# Patient Record
Sex: Male | Born: 1980 | Race: White | Hispanic: No | Marital: Married | Smoking: Never smoker
Health system: Southern US, Community
[De-identification: ages and names within clinical notes are randomized; demographics above are authoritative.]

## PROBLEM LIST (undated history)

## (undated) DIAGNOSIS — I10 Essential (primary) hypertension: Secondary | ICD-10-CM

## (undated) HISTORY — DX: Essential (primary) hypertension: I10

## (undated) HISTORY — PX: NO PAST SURGERIES: SHX2092

---

## 2020-11-30 ENCOUNTER — Other Ambulatory Visit (INDEPENDENT_AMBULATORY_CARE_PROVIDER_SITE_OTHER): Payer: PRIVATE HEALTH INSURANCE

## 2020-11-30 ENCOUNTER — Encounter: Payer: Self-pay | Admitting: Gastroenterology

## 2020-11-30 ENCOUNTER — Ambulatory Visit (INDEPENDENT_AMBULATORY_CARE_PROVIDER_SITE_OTHER): Payer: PRIVATE HEALTH INSURANCE | Admitting: Gastroenterology

## 2020-11-30 ENCOUNTER — Other Ambulatory Visit: Payer: Self-pay

## 2020-11-30 VITALS — BP 142/84 | HR 80 | Ht 67.0 in | Wt 214.4 lb

## 2020-11-30 DIAGNOSIS — D696 Thrombocytopenia, unspecified: Secondary | ICD-10-CM

## 2020-11-30 DIAGNOSIS — R748 Abnormal levels of other serum enzymes: Secondary | ICD-10-CM | POA: Diagnosis not present

## 2020-11-30 LAB — PROTIME-INR
INR: 1 ratio (ref 0.8–1.0)
Prothrombin Time: 11.7 s (ref 9.6–13.1)

## 2020-11-30 NOTE — Patient Instructions (Signed)
LABS:  Lab work has been ordered for you today. Our lab is located in the basement. Press "B" on the elevator. The lab is located at the first door on the left as you exit the elevator.  IMAGING:  . You will be contacted by [facility] Scheduling (Your caller ID will indicate phone # [phone]) in the next 2 days to schedule your Liver Elastography Ultrasound. If you have not heard from them within 2 business days, please call [facility] Scheduling at [phone] to follow up on the status of your appointment.    Liver Elastography Various chronic liver diseases such as hepatitis B, C, and fatty liver disease can lead to tissue damage and subsequent scar tissue formation. As the scar tissue accumulates, the liver loses some of its elasticity and becomes stiffer. Liver elastography involves the use of a surface ultrasound probe that delivers a low frequency pulse or shear wave to a small volume of liver tissue under the rib cage. The transmission of the sound wave is completely painless. How Is a Liver Elastography Performed? The liver is located in the right upper abdomen under the rib cage. Patients are asked to lie flat on an examination table. A technician places the FibroScan probe between the ribs on the right side of the lower chest wall. A series of 10 painless pulses are then applied to the liver. The results are recorded on the equipment and an overall liver stiffness score is generated. This score is then interpreted by a qualified physician to predict the likelihood of advanced fibrosis or cirrhosis.  Patients are asked to wear loose clothing and should not consume any liquids or solids for a minimum of 4 hours before the test to increase the likelihood of obtaining reliable test results. The scan will take 10 to 15 minutes to complete, but patients should plan on being available for 30 minutes to allow time for preparation

## 2020-11-30 NOTE — Progress Notes (Addendum)
Referring Provider: Drema Halon, FNP Primary Care Physician:  Patient, No Pcp Per  Reason for Consultation: Abnormal liver enzymes   IMPRESSION:  Elevated transaminases: Likely fatty liver by history    - serologies for HCV and HBV negative    - proceed with further evaluation Chronic thrombocytopenia: Suggests advanced fibrosis +/- cirrhosis    - no ongoing regular alcohol use    - FibroSCAN and FibroSURE for staging Fatty liver on prior imaging several years ago GERD - controlled with diet changes  Suspected fatty liver. Briefly reviewed the natural history and treatment options. Treatment is currently focused on working towards a healthy weight, regular exercise, and maximizing control of prediabetes and cholesterol abnormalities. Hopefully, medical therapy will be available in the future. There is a known increased risk of cardiovascular disease in the patients with NASH.   Given concurrent pre-diabetes and thrombocytopenia, I am recommending that we stage fatty liver disease need now to provide adequate care in the future.   PLAN: ANA, IgG, PT/INR FibroSCAN NASH FibroSURE Declined pantoprazole 40 mg QAM Follow-up after imaging  Please see the "Patient Instructions" section for addition details about the plan.  HPI: Sisto Edgerly is a 40 y.o. male referred by NP Ladona Ridgel for further evaluation of abnormal liver enzymes.  The history is obtained through the patient and review of his electronic health record.  He has a history of obesity, hypertension, hyperlipidemia, insulin resistance, vitamin D deficiency, and chronic thrombocytopenia. His wife and son accompany him to this appointment. He works for the [employer] in the [employer]. He is from [place].   Diagnosed with fatty liver in 2016 in [place] when he had an ultrasound performed at that time.   2020 had an annual physical and was diagnosed with hyperlipidemia, hypertension, and low  platelets. He made an effort to improve diet and exercise more regularly.   Prior blood donation several years ago.  No prior blood transfusion.  No history of jaundice or scleral icterus.  No history of use or experimentation with IV or intranasal street drugs.  No history of autoimmune disease.   Drinks beer socially, not even weekly. No history of heavy alcohol use.   GI ROS is negative except for reflux. But, this has largely resolved with the diet changes he made several years ago. Symptoms are not frequent enough to warrant treatment. He will use a Children's PeptoBismal PRN.   Labs 11/05/2019: TSH normal Labs 02/02/2020 show a platelet count of 98,000, hepatitis C antibody nonreactive, hep B core antibody total negative, hep B surface antigen negative, hep B surface antibody negative, iron 87, ferritin 236 Labs 10/15/2020 show a white count of 4.7 hemoglobin 17.4, platelets 105, RDW 13; normal CMP except for an AST of 52 and an ALT of 127.  Sodium 139, creatinine 1.04, total bilirubin 0.7.  No known family history of liver disease except for a brother with fatty liver. Father with diabetic kidney disease. Mother has hypertension.  No known family history of colon cancer or polyps. No family history of uterine/endometrial cancer, pancreatic cancer or gastric/stomach cancer.   Past Medical History:  Diagnosis Date  . Hypertension     Past Surgical History:  Procedure Laterality Date  . NO PAST SURGERIES      Current Outpatient Medications  Medication Sig Dispense Refill  . MILK THISTLE PO Take 1 tablet by mouth daily.    Marland Kitchen telmisartan (MICARDIS) 40 MG tablet Take 40 mg by mouth  daily.     No current facility-administered medications for this visit.    Allergies as of 11/30/2020  . (No Known Allergies)    Family History  Problem Relation Age of Onset  . Hypertension Mother   . Diabetes Father   . Kidney disease Father   . Colon cancer Neg Hx   . Esophageal cancer Neg Hx    . Pancreatic cancer Neg Hx   . Stomach cancer Neg Hx      Review of Systems: 12 system ROS is negative except as noted above.   Physical Exam: General:   Alert, in NAD. No scleral icterus. No bilateral temporal wasting.  Heart:  Regular rate and rhythm; no murmurs Pulm: Clear anteriorly; no wheezing Abdomen:  Soft. Nontender. Nondistended. Normal bowel sounds. No rebound or guarding. No fluid wave. Mild central ovesity.  LAD: No inguinal or umbilical LAD Extremities:  Without edema. Neurologic:  Alert and  oriented x4;  grossly normal neurologically; no asterixis or clonus. Skin: No jaundice. No palmar erythema. Few spider angioma on the chest wall.  Psych:  Alert and cooperative. Normal mood and affect.   Jamorris Ndiaye L. Orvan Falconer, MD, MPH 11/30/2020, 3:56 PM

## 2020-12-02 LAB — ANA: Anti Nuclear Antibody (ANA): NEGATIVE

## 2020-12-02 LAB — IGG: IgG (Immunoglobin G), Serum: 1037 mg/dL (ref 600–1640)

## 2020-12-03 LAB — NASH FIBROSURE
ALPHA 2-MACROGLOBULINS, QN: 164 mg/dL (ref 110–276)
ALT (SGPT) P5P: 134 IU/L — ABNORMAL HIGH (ref 0–55)
AST (SGOT) P5P: 68 IU/L — ABNORMAL HIGH (ref 0–40)
Apolipoprotein A-1: 102 mg/dL (ref 101–178)
Bilirubin, Total: 0.3 mg/dL (ref 0.0–1.2)
Cholesterol, Total: 229 mg/dL — ABNORMAL HIGH (ref 100–199)
Fibrosis Score: 0.47 — ABNORMAL HIGH (ref 0.00–0.21)
GGT: 39 IU/L (ref 0–65)
Glucose: 114 mg/dL — ABNORMAL HIGH (ref 65–99)
Haptoglobin: <10 mg/dL — ABNORMAL LOW (ref 17–317)
Height: 67 in
NASH Score: 0.75 — ABNORMAL HIGH
Steatosis Score: 0.8 — ABNORMAL HIGH (ref 0.00–0.30)
Triglycerides: 276 mg/dL — ABNORMAL HIGH (ref 0–149)
Weight: 214 [lb_av]

## 2020-12-06 ENCOUNTER — Other Ambulatory Visit: Payer: Self-pay

## 2020-12-06 DIAGNOSIS — K760 Fatty (change of) liver, not elsewhere classified: Secondary | ICD-10-CM

## 2020-12-14 ENCOUNTER — Ambulatory Visit (HOSPITAL_COMMUNITY): Payer: PRIVATE HEALTH INSURANCE

## 2020-12-17 ENCOUNTER — Other Ambulatory Visit: Payer: Self-pay

## 2020-12-17 ENCOUNTER — Ambulatory Visit (HOSPITAL_COMMUNITY)
Admission: RE | Admit: 2020-12-17 | Discharge: 2020-12-17 | Disposition: A | Discharge status: Home / Self Care | Payer: PRIVATE HEALTH INSURANCE | Source: Ambulatory Visit | Attending: Gastroenterology | Admitting: Gastroenterology

## 2020-12-17 DIAGNOSIS — R748 Abnormal levels of other serum enzymes: Secondary | ICD-10-CM | POA: Diagnosis present

## 2020-12-17 DIAGNOSIS — D696 Thrombocytopenia, unspecified: Secondary | ICD-10-CM | POA: Diagnosis present

## 2021-01-10 ENCOUNTER — Ambulatory Visit: Payer: PRIVATE HEALTH INSURANCE | Admitting: Gastroenterology

## 2021-01-19 ENCOUNTER — Ambulatory Visit: Payer: PRIVATE HEALTH INSURANCE | Admitting: Gastroenterology

## 2021-03-09 ENCOUNTER — Ambulatory Visit: Payer: PRIVATE HEALTH INSURANCE | Admitting: Gastroenterology

## 2021-06-11 IMAGING — US US ABDOMEN COMPLETE W/ ELASTOGRAPHY
2 series · 12 of 25 positions shown · non-contrast
Comparison: None

CLINICAL DATA: Abnormal LFTs, thrombocytopenia, history
hypertension

EXAM:
ULTRASOUND ABDOMEN
ULTRASOUND HEPATIC ELASTOGRAPHY
TECHNIQUE: Sonography of the upper abdomen was performed. In addition,
ultrasound elastography evaluation of the liver was performed. A
region of interest was placed within the right lobe of the liver.
Following application of a compressive sonographic pulse, tissue
compressibility was assessed. Multiple assessments were performed at
the selected site. Median tissue compressibility was determined.
Previously, hepatic stiffness was assessed by shear wave velocity.
Based on recently published Society of Radiologists in Ultrasound
consensus article, reporting is now recommended to be performed in
the SI units of pressure (kiloPascals) representing hepatic
stiffness/elasticity. The obtained result is compared to the
published reference standards. (cACLD = compensated Advanced Chronic
Liver Disease)

[Series 1: us abdomen complete w/ elastography · 11 of 85 slices shown (1 of 2)]
[im 5/85]
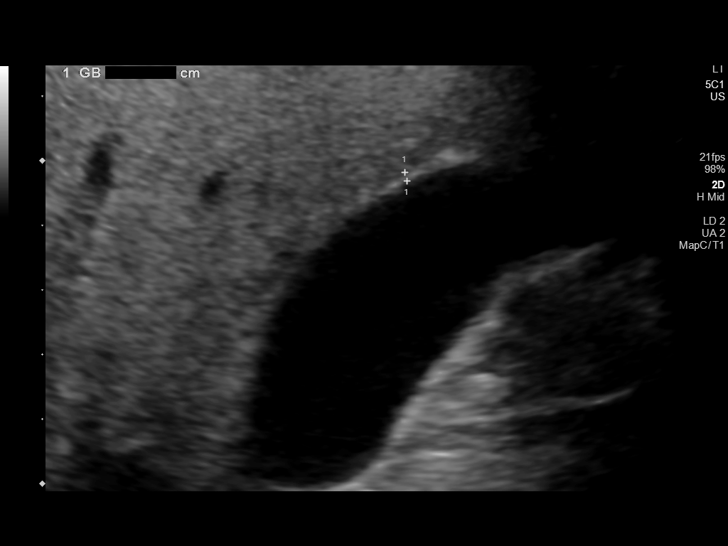
[im 13/85]
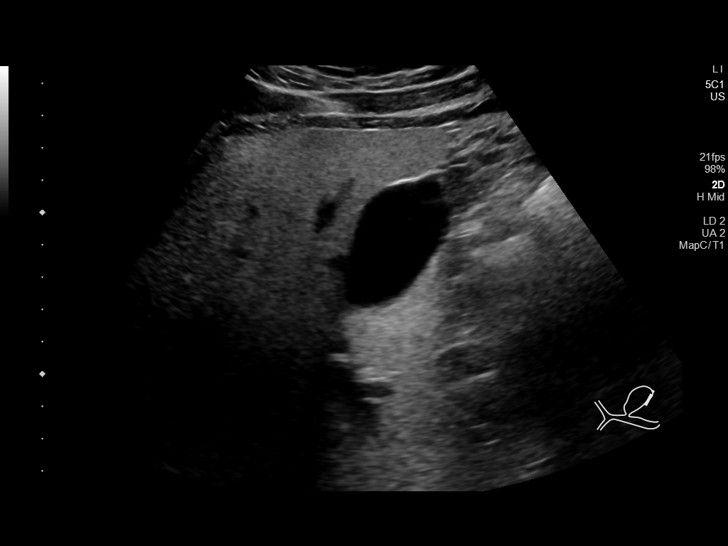
[im 21/85]
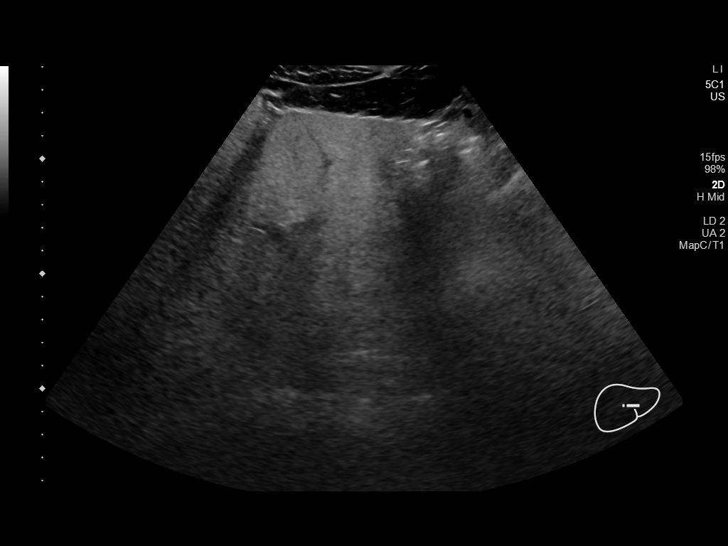
[im 29/85]
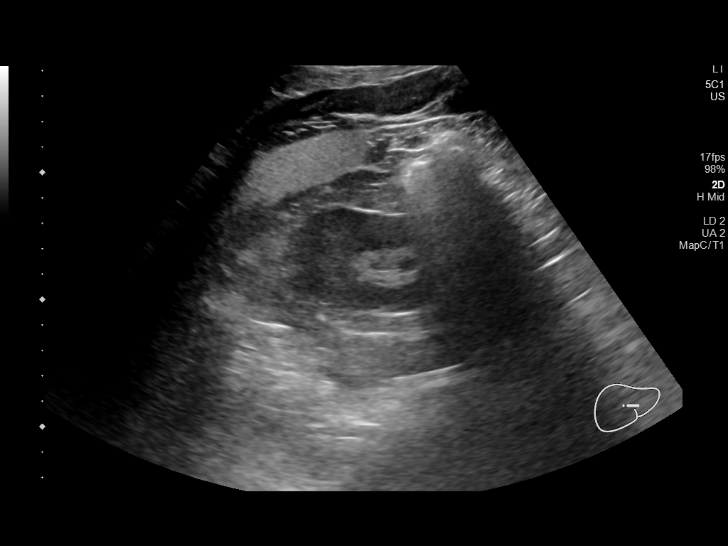
[im 37/85]
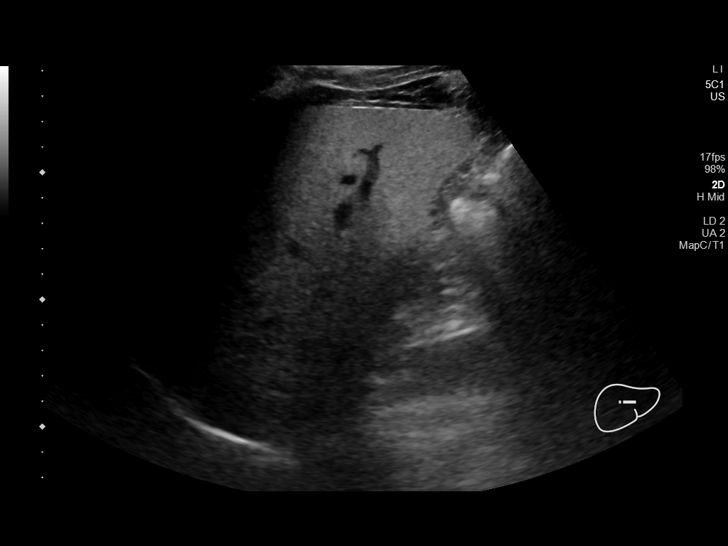
[im 45/85]
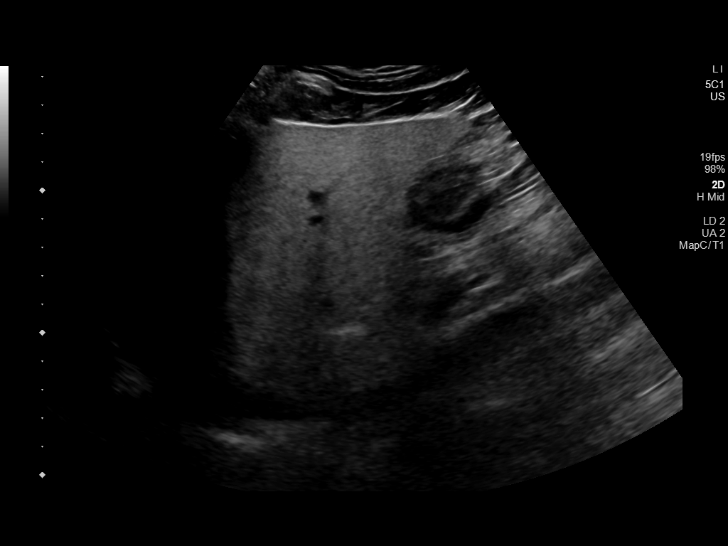
[im 53/85]
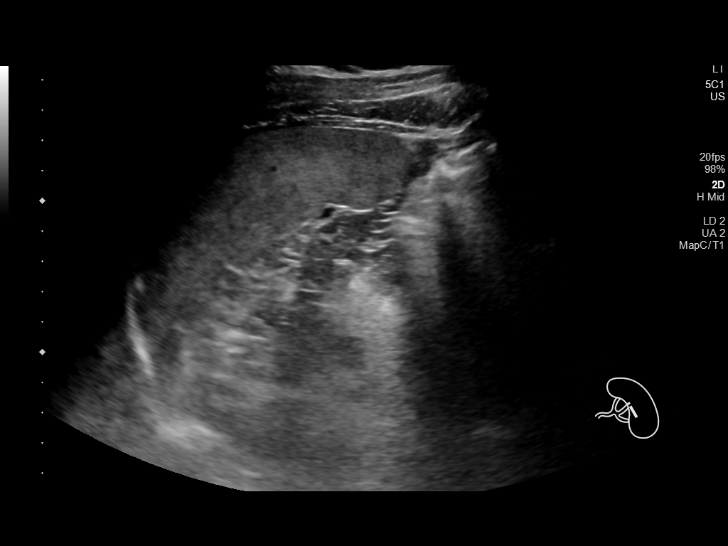
[im 61/85]
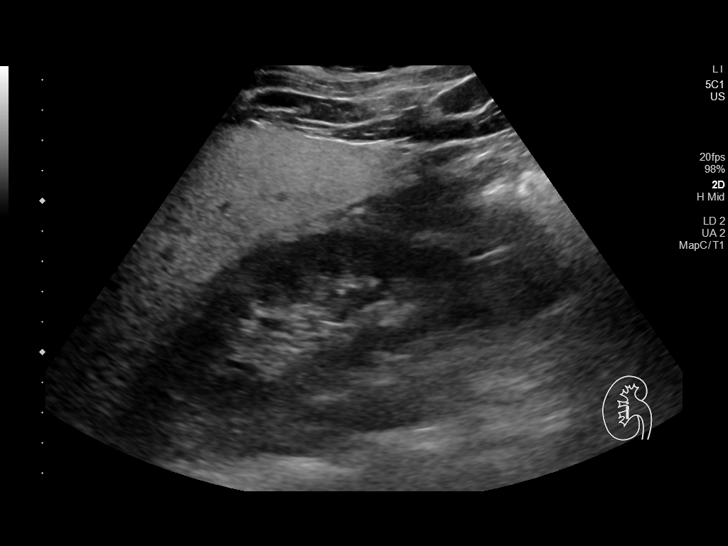
[im 69/85]
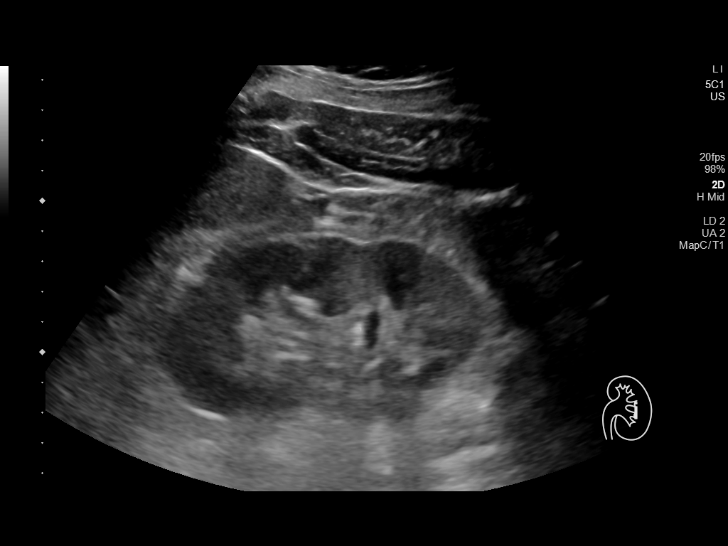
[im 77/85]
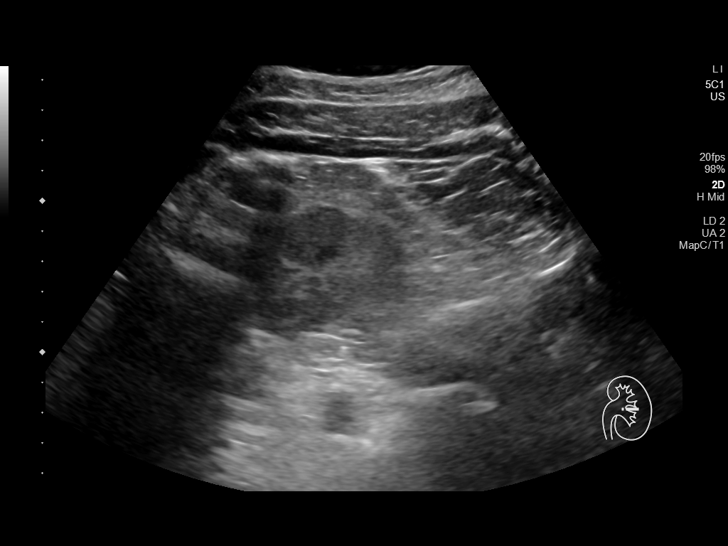
[im 85/85]
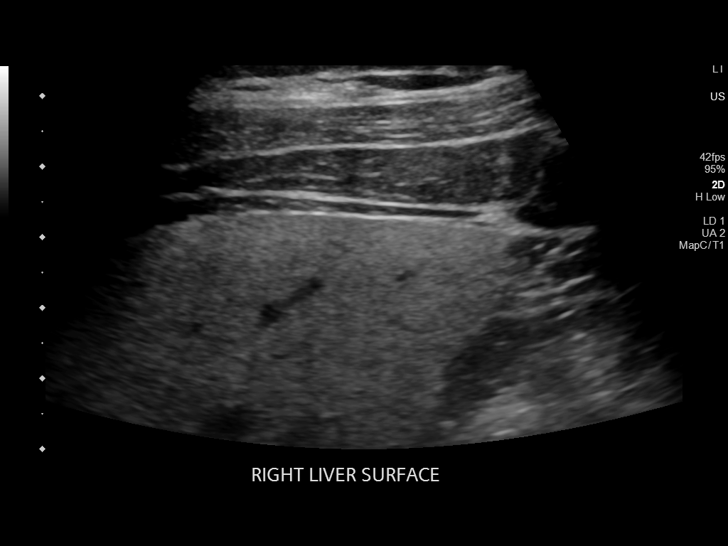

[Series 1001: us abdomen complete w/ elastography · 1 of 10 slices shown (2 of 2)]
[im 5/10]
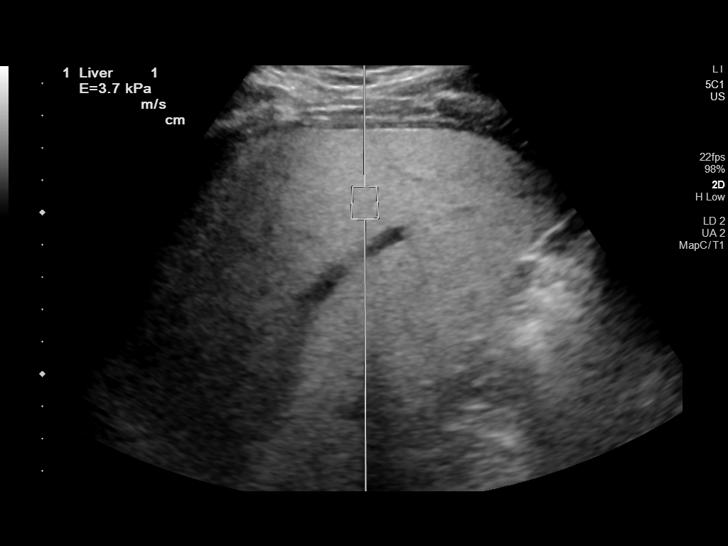

[12 of 25 positions shown; findings below may reference images not displayed]

FINDINGS: ULTRASOUND ABDOMEN

Gallbladder: Normally distended without stones or wall thickening.
No pericholecystic fluid or sonographic Murphy sign.

Common bile duct: Diameter: 3 mm, normal

Liver: Echogenic parenchyma, likely fatty infiltration though this
can be seen with cirrhosis and certain infiltrative disorders. No
discrete hepatic mass or nodularity. No intrahepatic biliary
dilatation. Portal vein is patent on color Doppler imaging with
normal direction of blood flow towards the liver.

IVC: Normal appearance

Pancreas: Tail obscured by bowel gas. Visualized portion normal
appearance

Spleen: Normal appearance, 9.3 cm length

Right Kidney: Length: 10.9 cm. Normal morphology without mass or
hydronephrosis.

Left Kidney: Length: 11.7 cm. Normal morphology without mass or
hydronephrosis.

Abdominal aorta: Normal caliber

Other findings: No free fluid

ULTRASOUND HEPATIC ELASTOGRAPHY

Device: Siemens Helix VTQ

Patient position: Supine

Transducer 5C1

Number of measurements: 10

Hepatic segment:  8

Median kPa:

IQR:

IQR/Median kPa ratio:

Data quality:  Good

Diagnostic category:  < or = 5 kPa: high probability of being normal

The use of hepatic elastography is applicable to patients with viral
hepatitis and non-alcoholic fatty liver disease. At this time, there
is insufficient data for the referenced cut-off values and use in
other causes of liver disease, including alcoholic liver disease.
Patients, however, may be assessed by elastography and serve as
their own reference standard/baseline.

In patients with non-alcoholic liver disease, the values suggesting
compensated advanced chronic liver disease (cACLD) may be lower, and
patients may need additional testing with elasticity results of [DATE]
kPa.

Please note that abnormal hepatic elasticity and shear wave
velocities may also be identified in clinical settings other than
with hepatic fibrosis, such as: acute hepatitis, elevated right
heart and central venous pressures including use of beta blockers,
Bhebhe disease (Dutch), infiltrative processes such as
mastocytosis/amyloidosis/infiltrative tumor/lymphoma, extrahepatic
cholestasis, with hyperemia in the post-prandial state, and with
liver transplantation. Correlation with patient history, laboratory
data, and clinical condition recommended.

Diagnostic Categories:

< or =5 kPa: high probability of being normal

< or =9 kPa: in the absence of other known clinical signs, rules [DATE] kPa and ?13 kPa: suggestive of cACLD, but needs further testing

>13 kPa: highly suggestive of cACLD

> or =17 kPa: highly suggestive of cACLD with an increased
probability of clinically significant portal hypertension
IMPRESSION: ULTRASOUND ABDOMEN:

Probable fatty infiltration of liver as above.

Incomplete visualization of pancreatic tail.

Remainder of exam normal.

ULTRASOUND HEPATIC ELASTOGRAPHY:

Median kPa:

Diagnostic category:  < or = 5 kPa: high probability of being normal

## 2024-09-30 ENCOUNTER — Encounter (INDEPENDENT_AMBULATORY_CARE_PROVIDER_SITE_OTHER): Payer: Self-pay | Admitting: *Deleted

## 2024-10-30 ENCOUNTER — Ambulatory Visit: Payer: PRIVATE HEALTH INSURANCE | Admitting: Internal Medicine
